# Patient Record
Sex: Male | Born: 1980 | Race: White | Hispanic: No | Marital: Single | Smoking: Current every day smoker
Health system: Southern US, Community
[De-identification: ages and names within clinical notes are randomized; demographics above are authoritative.]

---

## 2005-04-28 ENCOUNTER — Emergency Department: Payer: Self-pay | Admitting: Emergency Medicine

## 2016-08-10 ENCOUNTER — Encounter: Payer: Self-pay | Admitting: Emergency Medicine

## 2016-08-10 ENCOUNTER — Emergency Department
Admission: EM | Admit: 2016-08-10 | Discharge: 2016-08-10 | Disposition: A | Discharge status: Home / Self Care | Payer: Self-pay | Attending: Emergency Medicine | Admitting: Emergency Medicine

## 2016-08-10 ENCOUNTER — Emergency Department: Payer: Self-pay

## 2016-08-10 DIAGNOSIS — F17210 Nicotine dependence, cigarettes, uncomplicated: Secondary | ICD-10-CM | POA: Insufficient documentation

## 2016-08-10 DIAGNOSIS — K2900 Acute gastritis without bleeding: Secondary | ICD-10-CM | POA: Insufficient documentation

## 2016-08-10 LAB — CBC
HEMATOCRIT: 48.8 % (ref 40.0–52.0)
HEMOGLOBIN: 16.5 g/dL (ref 13.0–18.0)
MCH: 33.2 pg (ref 26.0–34.0)
MCHC: 33.8 g/dL (ref 32.0–36.0)
MCV: 98.1 fL (ref 80.0–100.0)
PLATELETS: 236 10*3/uL (ref 150–440)
RBC: 4.97 MIL/uL (ref 4.40–5.90)
RDW: 12.4 % (ref 11.5–14.5)
WBC: 10.3 10*3/uL (ref 3.8–10.6)

## 2016-08-10 LAB — URINALYSIS, COMPLETE (UACMP) WITH MICROSCOPIC
BILIRUBIN URINE: NEGATIVE
GLUCOSE, UA: NEGATIVE mg/dL
Hgb urine dipstick: NEGATIVE
KETONES UR: 5 mg/dL — AB
LEUKOCYTES UA: NEGATIVE
Nitrite: NEGATIVE
PH: 9 — AB (ref 5.0–8.0)
PROTEIN: 30 mg/dL — AB
Specific Gravity, Urine: 1.02 (ref 1.005–1.030)

## 2016-08-10 LAB — COMPREHENSIVE METABOLIC PANEL
ALT: 22 U/L (ref 17–63)
AST: 21 U/L (ref 15–41)
Albumin: 4.8 g/dL (ref 3.5–5.0)
Alkaline Phosphatase: 48 U/L (ref 38–126)
Anion gap: 8 (ref 5–15)
BUN: 13 mg/dL (ref 6–20)
CHLORIDE: 103 mmol/L (ref 101–111)
CO2: 28 mmol/L (ref 22–32)
CREATININE: 1 mg/dL (ref 0.61–1.24)
Calcium: 10.1 mg/dL (ref 8.9–10.3)
Glucose, Bld: 114 mg/dL — ABNORMAL HIGH (ref 65–99)
POTASSIUM: 4.5 mmol/L (ref 3.5–5.1)
SODIUM: 139 mmol/L (ref 135–145)
Total Bilirubin: 0.8 mg/dL (ref 0.3–1.2)
Total Protein: 8 g/dL (ref 6.5–8.1)

## 2016-08-10 LAB — LIPASE, BLOOD: Lipase: 21 U/L (ref 11–51)

## 2016-08-10 MED ORDER — ONDANSETRON HCL 4 MG/2ML IJ SOLN
4.0000 mg | Freq: Once | INTRAMUSCULAR | Status: AC
Start: 2016-08-10 — End: 2016-08-10
  Administered 2016-08-10: 4 mg via INTRAVENOUS
  Filled 2016-08-10: qty 2

## 2016-08-10 MED ORDER — MORPHINE SULFATE (PF) 4 MG/ML IV SOLN
4.0000 mg | Freq: Once | INTRAVENOUS | Status: AC
Start: 2016-08-10 — End: 2016-08-10
  Administered 2016-08-10: 4 mg via INTRAVENOUS
  Filled 2016-08-10: qty 1

## 2016-08-10 MED ORDER — FAMOTIDINE 20 MG PO TABS: 20.0000 mg | ORAL_TABLET | Freq: Two times a day (BID) | ORAL | 1 refills | Status: AC

## 2016-08-10 MED ORDER — MORPHINE SULFATE (PF) 4 MG/ML IV SOLN
INTRAVENOUS | Status: AC
  Filled 2016-08-10: qty 1

## 2016-08-10 MED ORDER — ONDANSETRON HCL 4 MG PO TABS: 4.0000 mg | ORAL_TABLET | Freq: Every day | ORAL | 1 refills | Status: DC | PRN

## 2016-08-10 MED ORDER — SODIUM CHLORIDE 0.9 % IV SOLN
Freq: Once | INTRAVENOUS | Status: AC
  Administered 2016-08-10: 1000 mL via INTRAVENOUS

## 2016-08-10 NOTE — ED Notes (Signed)
Pt alert and oriented X4, active, cooperative, pt in NAD. RR even and unlabored, color WNL.  Pt informed to return if any life threatening symptoms occur.   

## 2016-08-10 NOTE — ED Triage Notes (Signed)
Pt c/o Nausea and Vomiting for the last 5 days, reports no intake without emesis during that entire time.  Pt reports productive cough and pain in epigastric area today.  Pt NAD appears without vomiting att, drinking sweet tea.

## 2016-08-10 NOTE — ED Notes (Signed)
Pt alert and oriented X4, active, cooperative, pt in NAD. RR even and unlabored, color WNL.  RUQ pain, nausea, worsening when trying to eat. Denies diarrhea. EDP at bedside to assess.

## 2016-08-10 NOTE — ED Provider Notes (Signed)
[facility] Emergency Department Provider Note        Time seen: ----------------------------------------- 9:24 PM on 08/10/2016 -----------------------------------------    I have reviewed the triage vital signs and the nursing notes.   HISTORY  Chief Complaint Nausea    HPI Pamalee LeydenDavid Roller is a 35 y.o. male who presents the ER for nausea vomiting for the last 5 days. Patient reports no intake without vomiting during the entire time. Patient has had some cough and pain in the epigastrium and lower chest today. He presents drinking tea. Currently pain is 610, nothing makes it better. He denies a history of this.   History reviewed. No pertinent past medical history.  There are no active problems to display for this patient.   History reviewed. No pertinent surgical history.  Allergies Amoxicillin  Social History Social History  Substance Use Topics  . Smoking status: Current Every Day Smoker    Packs/day: 0.75    Types: Cigarettes  . Smokeless tobacco: Never Used  . Alcohol use Yes    Review of Systems Constitutional: Negative for fever. Cardiovascular: Negative for chest pain. Respiratory: Negative for shortness of breath.Positive for cough Gastrointestinal: Positive for abdominal pain, vomiting Genitourinary: Negative for dysuria. Musculoskeletal: Negative for back pain. Skin: Negative for rash. Neurological: Negative for headaches, focal weakness or numbness.  10-point ROS otherwise negative.  ____________________________________________   PHYSICAL EXAM:  VITAL SIGNS: ED Triage Vitals  Enc Vitals Group     BP 08/10/16 2015 (!) 163/99     Pulse Rate 08/10/16 2015 88     Resp 08/10/16 2015 18     Temp 08/10/16 2015 98.3 F (36.8 C)     Temp Source 08/10/16 2015 Oral     SpO2 08/10/16 2015 94 %     Weight 08/10/16 2016 165 lb (74.8 kg)     Height 08/10/16 2016 5\' 9"  (1.753 m)     Head Circumference --      Peak Flow --    Pain Score 08/10/16 2017 6     Pain Loc --      Pain Edu? --      Excl. in GC? --     Constitutional: Alert and oriented. Well appearing and in no distress. Eyes: Conjunctivae are normal. PERRL. Normal extraocular movements. ENT   Head: Normocephalic and atraumatic.   Nose: No congestion/rhinnorhea.   Mouth/Throat: Mucous membranes are moist.   Neck: No stridor. Cardiovascular: Normal rate, regular rhythm. No murmurs, rubs, or gallops. Respiratory: Normal respiratory effort without tachypnea nor retractions. Breath sounds are clear and equal bilaterally. No wheezes/rales/rhonchi. Gastrointestinal: Soft and nontender. Normal bowel sounds Musculoskeletal: Nontender with normal range of motion in all extremities. No lower extremity tenderness nor edema. Neurologic:  Normal speech and language. No gross focal neurologic deficits are appreciated.  Skin:  Skin is warm, dry and intact. No rash noted. Psychiatric: Mood and affect are normal. Speech and behavior are normal.  ____________________________________________  ED COURSE:  Pertinent labs & imaging results that were available during my care of the patient were reviewed by me and considered in my medical decision making (see chart for details). Clinical Course   Patient is in no distress, we'll assess with labs, give IV fluids and antiemetics.  Procedures ____________________________________________   LABS (pertinent positives/negatives)  Labs Reviewed  COMPREHENSIVE METABOLIC PANEL - Abnormal; Notable for the following:       Result Value   Glucose, Bld 114 (*)    All other components within  normal limits  URINALYSIS, COMPLETE (UACMP) WITH MICROSCOPIC - Abnormal; Notable for the following:    Color, Urine YELLOW (*)    APPearance CLEAR (*)    pH 9.0 (*)    Ketones, ur 5 (*)    Protein, ur 30 (*)    Bacteria, UA RARE (*)    Squamous Epithelial / LPF 0-5 (*)    All other components within normal limits  LIPASE,  BLOOD  CBC    RADIOLOGY  Acute abdominal series Is unremarkable ____________________________________________  FINAL ASSESSMENT AND PLAN  Abdominal pain, vomiting  Plan: Patient with labs and imaging as dictated above. She is in no distress, he'll be placed on ant acids and antiemetics. He is stable for outpatient follow-up with his doctor. Likely viral etiology.   Emily FilbertWilliams, Jylan Loeza E, MD   Note: This dictation was prepared with Dragon dictation. Any transcriptional errors that result from this process are unintentional    Emily FilbertJonathan E Lamari Beckles, MD 08/10/16 2227

## 2016-08-10 NOTE — ED Notes (Signed)
Patient transported to X-ray 

## 2017-08-01 ENCOUNTER — Other Ambulatory Visit: Payer: Self-pay

## 2017-08-01 ENCOUNTER — Encounter: Payer: Self-pay | Admitting: Emergency Medicine

## 2017-08-01 DIAGNOSIS — M25551 Pain in right hip: Secondary | ICD-10-CM | POA: Insufficient documentation

## 2017-08-01 DIAGNOSIS — Z79899 Other long term (current) drug therapy: Secondary | ICD-10-CM | POA: Insufficient documentation

## 2017-08-01 DIAGNOSIS — F17210 Nicotine dependence, cigarettes, uncomplicated: Secondary | ICD-10-CM | POA: Insufficient documentation

## 2017-08-01 DIAGNOSIS — M545 Low back pain: Secondary | ICD-10-CM | POA: Insufficient documentation

## 2017-08-01 NOTE — ED Triage Notes (Signed)
Pt arrives ambulatory to triage with c/o lower back pain x 3-4 months. 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 Pt is in NAD.

## 2017-08-02 ENCOUNTER — Emergency Department
Admission: EM | Admit: 2017-08-02 | Discharge: 2017-08-02 | Disposition: A | Discharge status: Home / Self Care | Payer: Self-pay | Attending: Emergency Medicine | Admitting: Emergency Medicine

## 2017-08-02 ENCOUNTER — Emergency Department: Payer: Self-pay

## 2017-08-02 DIAGNOSIS — M545 Low back pain, unspecified: Secondary | ICD-10-CM

## 2017-08-02 MED ORDER — KETOROLAC TROMETHAMINE 30 MG/ML IJ SOLN
15.0000 mg | Freq: Once | INTRAMUSCULAR | Status: AC
  Administered 2017-08-02: 15 mg via INTRAMUSCULAR
  Filled 2017-08-02: qty 1

## 2017-08-02 MED ORDER — HYDROCODONE-ACETAMINOPHEN 5-325 MG PO TABS: 1.0000 | ORAL_TABLET | ORAL | 0 refills | Status: DC | PRN

## 2017-08-02 MED ORDER — ACETAMINOPHEN 325 MG PO TABS
650.0000 mg | ORAL_TABLET | Freq: Once | ORAL | Status: AC
  Administered 2017-08-02: 650 mg via ORAL
  Filled 2017-08-02: qty 2

## 2017-08-02 MED ORDER — OXYCODONE-ACETAMINOPHEN 5-325 MG PO TABS
1.0000 | ORAL_TABLET | Freq: Once | ORAL | Status: AC
  Administered 2017-08-02: 1 via ORAL
  Filled 2017-08-02: qty 1

## 2017-08-02 MED ORDER — PREDNISONE 20 MG PO TABS
60.0000 mg | ORAL_TABLET | ORAL | Status: AC
  Administered 2017-08-02: 60 mg via ORAL
  Filled 2017-08-02: qty 3

## 2017-08-02 MED ORDER — IBUPROFEN 600 MG PO TABS: ORAL_TABLET | ORAL | 0 refills | Status: DC

## 2017-08-02 MED ORDER — PREDNISONE 10 MG PO TABS: ORAL_TABLET | ORAL | 0 refills | Status: DC

## 2017-08-02 NOTE — ED Provider Notes (Signed)
[facility] Emergency Department Provider Note  ____________________________________________   First MD Initiated Contact with Patient 08/02/17 [phone]     (approximate)  I have reviewed the triage vital signs and the nursing notes.   HISTORY  Chief Complaint Back Pain    HPI Harry LeydenDavid Ebling is a 36 y.o. male with no known chronic medical issues and no primary care doctor who presents for evaluation of 3-4 months of right-sided hip or back pain.  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  Rest makes it a little bit better but it never goes away.  It is been gradually worsening over an extended period of time and is now causing him difficulty with sleeping as well as with working.  He has no numbness or tingling or pain that radiates down his leg; the pain is fairly well isolated to his right hip.  He denies fever/chills, chest pain, shortness of breath, unintentional weight loss, nausea, vomiting, abdominal pain, and dysuria or any other urinary abnormalities.  He has not tried anything in particular to make it better.  He does not have a doctor or any insurance which is why he has not had the pain evaluated before now.  He is able to ambulate but sometimes limps because of the pain.  History reviewed. No pertinent past medical history.  There are no active problems to display for this patient.   History reviewed. No pertinent surgical history.  Prior to Admission medications   Medication Sig Start Date End Date Taking? Authorizing Provider  famotidine (PEPCID) 20 MG tablet Take 1 tablet (20 mg total) by mouth 2 (two) times daily. 08/10/16   Emily FilbertWilliams, Jonathan E, MD  HYDROcodone-acetaminophen (NORCO/VICODIN) 5-325 MG tablet Take 1-2 tablets by mouth every 4 (four) hours as needed for moderate pain. 08/02/17   Loleta RoseForbach, Harrietta Incorvaia, MD  ibuprofen (ADVIL,MOTRIN)  600 MG tablet Take 1 tablet by mouth three times daily with meals 08/02/17   Loleta RoseForbach, Rain Friedt, MD  ondansetron (ZOFRAN) 4 MG tablet Take 1 tablet (4 mg total) by mouth daily as needed for nausea or vomiting. 08/10/16   Emily FilbertWilliams, Jonathan E, MD  predniSONE (DELTASONE) 10 MG tablet Take 6 tabs (60 mg) PO x 3 days, then take 4 tabs (40 mg) PO x 3 days, then take 2 tabs (20 mg) PO x 3 days, then take 1 tab (10 mg) PO x 3 days, then take 1/2 tab (5 mg) PO x 4 days. 08/02/17   Loleta RoseForbach, Hani Patnode, MD    Allergies Amoxicillin  No family history on file.  Social History Social History   Tobacco Use  . Smoking status: Current Every Day Smoker    Packs/day: 0.50    Types: Cigarettes  . Smokeless tobacco: Never Used  Substance Use Topics  . Alcohol use: Yes  . Drug use: Yes    Types: Marijuana    Review of Systems Constitutional: No fever/chills Cardiovascular: Denies chest pain. Respiratory: Denies shortness of breath. Gastrointestinal: No abdominal pain.  No nausea, no vomiting.  No diarrhea.  No constipation. Genitourinary: Negative for dysuria, urinary incontinence, and urinary frequency Musculoskeletal: Right sided low back vs hip pain.  No RLE pain. Neurological: Negative for headaches, focal weakness or numbness.   ____________________________________________   PHYSICAL EXAM:  VITAL SIGNS: ED Triage Vitals  Enc Vitals Group     BP 08/01/17 2349 120/90  Pulse Rate 08/01/17 2349 92     Resp 08/01/17 2349 18     Temp 08/01/17 2349 97.9 F (36.6 C)     Temp Source 08/01/17 2349 Oral     SpO2 08/01/17 2349 96 %     Weight 08/01/17 2348 77.1 kg (170 lb)     Height 08/01/17 2348 1.753 m (5\' 9" )     Head Circumference --      Peak Flow --      Pain Score 08/01/17 2348 8     Pain Loc --      Pain Edu? --      Excl. in GC? --     Constitutional: Alert and oriented.  Disheveled but generally well-appearing.  No acute distress Eyes: Conjunctivae are normal.  Head:  Atraumatic. Respiratory: Normal respiratory effort.  No retractions.  Musculoskeletal: No lower extremity tenderness nor edema. No gross deformities of extremities.  Tenderness to palpation in the region of the right sacroiliac joint as well as lateral to the joint with no palpable abnormalities, just the tenderness. Neurologic:  Normal speech and language. No gross focal neurologic deficits are appreciated.  Ambulatory without difficulty Skin:  Skin is warm, dry and intact. No rash noted, no erythema, no induration or fluctuance around the site of tenderness, no indication of any localized infection Psychiatric: Mood and affect are normal. Speech and behavior are normal.  ____________________________________________   LABS (all labs ordered are listed, but only abnormal results are displayed)  Labs Reviewed - No data to display ____________________________________________  EKG  None - EKG not ordered by ED physician ____________________________________________  RADIOLOGY   Dg Pelvis 1-2 Views  Result Date: 08/02/2017 CLINICAL DATA:  Right iliac crest and sacroiliac joint pain for 3-4 months. No known injury. EXAM: PELVIS - 1-2 VIEW COMPARISON:  None. FINDINGS: The cortical margins of the bony pelvis are intact. No fracture. Pubic symphysis and sacroiliac joints are congruent. Both femoral heads are well-seated in the respective acetabula. No focal bone abnormality. IMPRESSION: Negative radiograph of the pelvis. Electronically Signed   By: Rubye Oaks M.D.   On: 08/02/2017 04:46    ____________________________________________   PROCEDURES  Critical Care performed: No   Procedure(s) performed:   Procedures   ____________________________________________   INITIAL IMPRESSION / ASSESSMENT AND PLAN / ED COURSE  As part of my medical decision making, I reviewed the following data within the electronic MEDICAL RECORD NUMBER Nursing notes reviewed and incorporated     Differential diagnosis includes, but is not limited to, musculoskeletal pain/strain, sacroiliitis, osteomyelitis/discitis, herniated disc, occult fracture.  Given the patient's physical exam and history of present illness, I think that subacute/chronic musculoskeletal strain and/or sacroiliitis are most likely.  Given the degree of the patient's discomfort I will treat him with a combination of therapies including a prednisone taper, NSAIDs, and Norco.  I reviewed the patient's prescription history over the last 24 months in the multi-state controlled substances database(s) that includes [place], [place], [place], [place], [place], [place], [place], [place], [place], [place], [place], [place], [place], [place], and [place].  The patient has filled no controlled substances during that time.  Stressed to him the importance of following up as an outpatient and gave him the number for the patient navigator to get set up with an outpatient doctor as well as the name and number for Dr. Yves Dill with physiatry who may be able to help him with the chronic pain in his right hip.  I gave  my usual and customary return precautions and he understands and agrees with the plan.  Clinical Course as of Aug 02 554  Sat Aug 02, 2017  [id number] DG Pelvis 1-2 Views [CF]    Clinical Course User Index [CF] Loleta RoseForbach, Rayaan Lorah, MD    ____________________________________________  FINAL CLINICAL IMPRESSION(S) / ED DIAGNOSES  Final diagnoses:  Acute right-sided low back pain without sciatica     MEDICATIONS GIVEN DURING THIS VISIT:  Medications  predniSONE (DELTASONE) tablet 60 mg (60 mg Oral Given 08/02/17 0520)  oxyCODONE-acetaminophen (PERCOCET/ROXICET) 5-325 MG per tablet 1 tablet (1 tablet Oral Given 08/02/17 0520)  acetaminophen (TYLENOL) tablet 650 mg (650 mg Oral Given 08/02/17 0520)  ketorolac (TORADOL) 30 MG/ML injection 15 mg (15 mg Intramuscular Given 08/02/17 0520)     ED  Discharge Orders        Ordered    predniSONE (DELTASONE) 10 MG tablet     08/02/17 0510    HYDROcodone-acetaminophen (NORCO/VICODIN) 5-325 MG tablet  Every 4 hours PRN     08/02/17 0510    ibuprofen (ADVIL,MOTRIN) 600 MG tablet     08/02/17 0510       Note:  This document was prepared using Dragon voice recognition software and may include unintentional dictation errors.    Loleta RoseForbach, Mika Anastasi, MD 08/02/17 [phone]

## 2017-08-02 NOTE — ED Notes (Signed)
Reviewed discharge instructions, follow-up care, and prescriptions with patient. Patient verbalized understanding of all information reviewed. Patient stable, with no distress noted at this time.    

## 2017-08-02 NOTE — Discharge Instructions (Signed)
You have been seen in the Emergency Department (ED)  today for back pain.  Your workup and exam have not shown any acute abnormalities and you are likely suffering from muscle strain or possible problems with your discs, but there is no treatment that will fix your symptoms at this time.  Please take Motrin (ibuprofen) as needed for your pain according to the instructions written on the box.  Alternatively, for the next five days you can take 600mg three times daily with meals (it may upset your stomach). ° °Take Norco as prescribed for severe pain. Do not drink alcohol, drive or participate in any other potentially dangerous activities while taking this medication as it may make you sleepy. Do not take this medication with any other sedating medications, either prescription or over-the-counter. If you were prescribed Percocet or Vicodin, do not take these with acetaminophen (Tylenol) as it is already contained within these medications. °  °This medication is an opiate (or narcotic) pain medication and can be habit forming.  Use it as little as possible to achieve adequate pain control.  Do not use or use it with extreme caution if you have a history of opiate abuse or dependence.  If you are on a pain contract with your primary care doctor or a pain specialist, be sure to let them know you were prescribed this medication today from the [facility].  This medication is intended for your use only - do not give any to anyone else and keep it in a secure place where nobody else, especially children, have access to it.  It will also cause or worsen constipation, so you may want to consider taking an over-the-counter stool softener while you are taking this medication. ° °Please follow up with your doctor as soon as possible regarding today's ED visit and your back pain.  Return to the ED for worsening back pain, fever, weakness or numbness of either leg, or if you develop either (1) an  inability to urinate or have bowel movements, or (2) loss of your ability to control your bathroom functions (if you start having "accidents"), or if you develop other new symptoms that concern you. ° °

## 2017-08-17 ENCOUNTER — Other Ambulatory Visit: Payer: Self-pay

## 2017-08-17 ENCOUNTER — Emergency Department: Payer: Self-pay

## 2017-08-17 ENCOUNTER — Emergency Department
Admission: EM | Admit: 2017-08-17 | Discharge: 2017-08-17 | Disposition: A | Discharge status: Home / Self Care | Payer: Self-pay | Attending: Emergency Medicine | Admitting: Emergency Medicine

## 2017-08-17 ENCOUNTER — Encounter: Payer: Self-pay | Admitting: Emergency Medicine

## 2017-08-17 DIAGNOSIS — M5416 Radiculopathy, lumbar region: Secondary | ICD-10-CM | POA: Insufficient documentation

## 2017-08-17 DIAGNOSIS — M5410 Radiculopathy, site unspecified: Secondary | ICD-10-CM

## 2017-08-17 DIAGNOSIS — F17210 Nicotine dependence, cigarettes, uncomplicated: Secondary | ICD-10-CM | POA: Insufficient documentation

## 2017-08-17 DIAGNOSIS — Z791 Long term (current) use of non-steroidal anti-inflammatories (NSAID): Secondary | ICD-10-CM | POA: Insufficient documentation

## 2017-08-17 MED ORDER — TRAMADOL HCL 50 MG PO TABS
50.0000 mg | ORAL_TABLET | Freq: Once | ORAL | Status: AC
  Administered 2017-08-17: 50 mg via ORAL
  Filled 2017-08-17: qty 1

## 2017-08-17 MED ORDER — IBUPROFEN 800 MG PO TABS: 800.0000 mg | ORAL_TABLET | Freq: Three times a day (TID) | ORAL | 0 refills | Status: DC | PRN

## 2017-08-17 MED ORDER — CYCLOBENZAPRINE HCL 10 MG PO TABS: 10.0000 mg | ORAL_TABLET | Freq: Three times a day (TID) | ORAL | 0 refills | Status: DC | PRN

## 2017-08-17 MED ORDER — CYCLOBENZAPRINE HCL 10 MG PO TABS
10.0000 mg | ORAL_TABLET | Freq: Once | ORAL | Status: AC
  Administered 2017-08-17: 10 mg via ORAL
  Filled 2017-08-17: qty 1

## 2017-08-17 MED ORDER — TRAMADOL HCL 50 MG PO TABS: 50.0000 mg | ORAL_TABLET | Freq: Four times a day (QID) | ORAL | 0 refills | Status: DC | PRN

## 2017-08-17 MED ORDER — IBUPROFEN 800 MG PO TABS
800.0000 mg | ORAL_TABLET | Freq: Once | ORAL | Status: AC
  Administered 2017-08-17: 800 mg via ORAL
  Filled 2017-08-17: qty 1

## 2017-08-17 NOTE — ED Triage Notes (Signed)
Pt presents to ED c/o L-sided lower back pain radiating into L hip and lower leg for several months. States he was seen in ED recently and given prednisone that helped somewhat but he has run out. Pt does a lot of heavy lifting for work [employer]).

## 2017-08-17 NOTE — ED Provider Notes (Signed)
[facility] Emergency Department Provider Note   _________________________________________[facility]___   First MD Initiated Contact with Patient 08/17/17 1955     (approximate)  I have reviewed the triage vital signs and the nursing notes.   HISTORY  Chief Complaint Back Pain    HPI Harry Graham is a 36 y.o. male patient complaining of back pain with radicular component to the left lower extremity. Patient said he was seen recently in the ED was given prednisone was seen and helped his condition. Patient state he ran out of the medication his complaint return. Patient states he performs a great deal of heavy lifting at work. Patient denies bladder or bowel dysfunction. Patient rates pain as a 9/10. Patient described a pain as "shooting".  History reviewed. No pertinent past medical history.  There are no active problems to display for this patient.   History reviewed. No pertinent surgical history.  Prior to Admission medications   Medication Sig Start Date End Date Taking? Authorizing Provider  cyclobenzaprine (FLEXERIL) 10 MG tablet Take 1 tablet (10 mg total) by mouth 3 (three) times daily as needed. 08/17/17   Joni ReiningSmith, Willie Plain K, PA-C  famotidine (PEPCID) 20 MG tablet Take 1 tablet (20 mg total) by mouth 2 (two) times daily. 08/10/16   Emily FilbertWilliams, Jonathan E, MD  HYDROcodone-acetaminophen (NORCO/VICODIN) 5-325 MG tablet Take 1-2 tablets by mouth every 4 (four) hours as needed for moderate pain. 08/02/17   Loleta RoseForbach, Cory, MD  ibuprofen (ADVIL,MOTRIN) 600 MG tablet Take 1 tablet by mouth three times daily with meals 08/02/17   Loleta RoseForbach, Cory, MD  ibuprofen (ADVIL,MOTRIN) 800 MG tablet Take 1 tablet (800 mg total) by mouth every 8 (eight) hours as needed. 08/17/17   Joni ReiningSmith, Anothony Bursch K, PA-C  ondansetron (ZOFRAN) 4 MG tablet Take 1 tablet (4 mg total) by mouth daily as needed for nausea or vomiting. 08/10/16   Emily FilbertWilliams, Jonathan E, MD  predniSONE (DELTASONE) 10 MG tablet Take 6  tabs (60 mg) PO x 3 days, then take 4 tabs (40 mg) PO x 3 days, then take 2 tabs (20 mg) PO x 3 days, then take 1 tab (10 mg) PO x 3 days, then take 1/2 tab (5 mg) PO x 4 days. 08/02/17   Loleta RoseForbach, Cory, MD  traMADol (ULTRAM) 50 MG tablet Take 1 tablet (50 mg total) by mouth every 6 (six) hours as needed for moderate pain. 08/17/17   Joni ReiningSmith, Aydian Dimmick K, PA-C    Allergies Amoxicillin  History reviewed. No pertinent family history.  Social History Social History   Tobacco Use  . Smoking status: Current Every Day Smoker    Packs/day: 0.50    Types: Cigarettes  . Smokeless tobacco: Never Used  Substance Use Topics  . Alcohol use: Yes  . Drug use: Yes    Types: Marijuana    Review of Systems  Constitutional: No fever/chills Eyes: No visual changes. ENT: No sore throat. Cardiovascular: Denies chest pain. Respiratory: Denies shortness of breath. Gastrointestinal: No abdominal pain.  No nausea, no vomiting.  No diarrhea.  No constipation. Genitourinary: Negative for dysuria. Musculoskeletal: Positive for back pain  Skin: Negative for rash. Neurological: Negative for headaches, focal weakness or numbness. Allergic/Immunilogical: Amoxil ____________________________________________   PHYSICAL EXAM:  VITAL SIGNS: ED Triage Vitals  Enc Vitals Group     BP 08/17/17 1910 (!) 107/92     Pulse Rate 08/17/17 1910 90     Resp 08/17/17 1910 18     Temp 08/17/17 1910 98 F (  36.7 C)     Temp Source 08/17/17 1910 Oral     SpO2 08/17/17 1910 98 %     Weight 08/17/17 1912 165 lb (74.8 kg)     Height 08/17/17 1912 5\' 9"  (1.753 m)     Head Circumference --      Peak Flow --      Pain Score 08/17/17 1919 9     Pain Loc --      Pain Edu? --      Excl. in GC? --     Constitutional: Alert and oriented. Well appearing and in no acute distress. Cardiovascular: Normal rate, regular rhythm. Grossly normal heart sounds.  Good peripheral circulation. Respiratory: Normal respiratory effort.  No  retractions. Lungs CTAB. Gastrointestinal: Soft and nontender. No distention. No abdominal bruits. No CVA tenderness. Musculoskeletal: No obvious lumbar spine deformity. No guarding palpation spinal processes. Patient decreased range of motion right lateral movements. Patient Straight-leg test.  Neurologic:  Normal speech and language. No gross focal neurologic deficits are appreciated. No gait instability. Skin:  Skin is warm, dry and intact. No rash noted. Psychiatric: Mood and affect are normal. Speech and behavior are normal.  ____________________________________________   LABS (all labs ordered are listed, but only abnormal results are displayed)  Labs Reviewed - No data to display ____________________________________________  EKG   ____________________________________________  RADIOLOGY  Dg Lumbar Spine 2-3 Views  Result Date: 08/17/2017 CLINICAL DATA:  Lumbago with left-sided radicular symptoms EXAM: LUMBAR SPINE - 2-3 VIEW COMPARISON:  None. FINDINGS: Upright frontal and upright lateral images were obtained. There are 5 non-rib-bearing lumbar type vertebral bodies. There is no fracture or spondylolisthesis. The disc spaces appear normal. No erosive change. IMPRESSION: No fracture or spondylolisthesis.  No evident arthropathy. Electronically Signed   By: Bretta BangWilliam  Woodruff III M.D.   On: 08/17/2017 20:30    ____________________________________________   PROCEDURES  Procedure(s) performed: None  Procedures  Critical Care performed: No  ____________________________________________   INITIAL IMPRESSION / ASSESSMENT AND PLAN / ED COURSE  As part of my medical decision making, I reviewed the following data within the electronic MEDICAL RECORD NUMBER    Radicular back pain. Discussed x-ray finding with patient. Patient given discharge Instructions. Patient advised we will lumbar support when working. Patient advised take medication as directed and follow orthopedics  condition persists.      ____________________________________________   FINAL CLINICAL IMPRESSION(S) / ED DIAGNOSES  Final diagnoses:  Radicular low back pain     ED Discharge Orders        Ordered    ibuprofen (ADVIL,MOTRIN) 800 MG tablet  Every 8 hours PRN     08/17/17 2035    cyclobenzaprine (FLEXERIL) 10 MG tablet  3 times daily PRN     08/17/17 2035    traMADol (ULTRAM) 50 MG tablet  Every 6 hours PRN     08/17/17 2035       Note:  This document was prepared using Dragon voice recognition software and may include unintentional dictation errors.    Joni ReiningSmith, Johnnae Impastato K, PA-C 08/17/17 2041    Arnaldo NatalMalinda, Paul F, MD 08/17/17 [phone]

## 2017-08-17 NOTE — ED Notes (Signed)
Pt states several months of low back pain radiating down posterior left hip. Pt states he feels like he limps at times "to keep the pressure off". Pt ambulatory in room without difficulty. Pt appears in no acute distress.

## 2020-09-02 ENCOUNTER — Other Ambulatory Visit: Payer: Self-pay

## 2020-09-02 ENCOUNTER — Emergency Department: Payer: Self-pay

## 2020-09-02 ENCOUNTER — Emergency Department
Admission: EM | Admit: 2020-09-02 | Discharge: 2020-09-02 | Disposition: A | Discharge status: Home / Self Care | Payer: Self-pay | Attending: Emergency Medicine | Admitting: Emergency Medicine

## 2020-09-02 DIAGNOSIS — R079 Chest pain, unspecified: Secondary | ICD-10-CM | POA: Insufficient documentation

## 2020-09-02 DIAGNOSIS — N132 Hydronephrosis with renal and ureteral calculous obstruction: Secondary | ICD-10-CM | POA: Insufficient documentation

## 2020-09-02 DIAGNOSIS — M545 Low back pain, unspecified: Secondary | ICD-10-CM | POA: Insufficient documentation

## 2020-09-02 DIAGNOSIS — E876 Hypokalemia: Secondary | ICD-10-CM | POA: Insufficient documentation

## 2020-09-02 DIAGNOSIS — N200 Calculus of kidney: Secondary | ICD-10-CM

## 2020-09-02 DIAGNOSIS — F17210 Nicotine dependence, cigarettes, uncomplicated: Secondary | ICD-10-CM | POA: Insufficient documentation

## 2020-09-02 LAB — URINALYSIS, COMPLETE (UACMP) WITH MICROSCOPIC
Bilirubin Urine: NEGATIVE
Glucose, UA: NEGATIVE mg/dL
Ketones, ur: NEGATIVE mg/dL
Leukocytes,Ua: NEGATIVE
Nitrite: NEGATIVE
Protein, ur: 100 mg/dL — AB
RBC / HPF: >50 RBC/hpf — ABNORMAL HIGH (ref 0–5)
Specific Gravity, Urine: 1.021 (ref 1.005–1.030)
Squamous Epithelial / LPF: NONE SEEN (ref 0–5)
WBC, UA: >50 WBC/hpf — ABNORMAL HIGH (ref 0–5)
pH: 6 (ref 5.0–8.0)

## 2020-09-02 LAB — BASIC METABOLIC PANEL
Anion gap: 16 — ABNORMAL HIGH (ref 5–15)
BUN: 16 mg/dL (ref 6–20)
CO2: 24 mmol/L (ref 22–32)
Calcium: 9.8 mg/dL (ref 8.9–10.3)
Chloride: 98 mmol/L (ref 98–111)
Creatinine, Ser: 0.9 mg/dL (ref 0.61–1.24)
GFR, Estimated: >60 mL/min (ref >60)
Glucose, Bld: 163 mg/dL — ABNORMAL HIGH (ref 70–99)
Potassium: 3.2 mmol/L — ABNORMAL LOW (ref 3.5–5.1)
Sodium: 138 mmol/L (ref 135–145)

## 2020-09-02 LAB — CBC
HCT: 44.9 % (ref 39.0–52.0)
Hemoglobin: 15.6 g/dL (ref 13.0–17.0)
MCH: 33.4 pg (ref 26.0–34.0)
MCHC: 34.7 g/dL (ref 30.0–36.0)
MCV: 96.1 fL (ref 80.0–100.0)
Platelets: 271 10*3/uL (ref 150–400)
RBC: 4.67 MIL/uL (ref 4.22–5.81)
RDW: 11.3 % — ABNORMAL LOW (ref 11.5–15.5)
WBC: 8.9 10*3/uL (ref 4.0–10.5)
nRBC: 0 % (ref 0.0–0.2)

## 2020-09-02 LAB — HEMOGLOBIN A1C
Hgb A1c MFr Bld: 5.5 % (ref 4.8–5.6)
Mean Plasma Glucose: 111.15 mg/dL

## 2020-09-02 LAB — HEPATIC FUNCTION PANEL
ALT: 18 U/L (ref 0–44)
AST: 26 U/L (ref 15–41)
Albumin: 4.7 g/dL (ref 3.5–5.0)
Alkaline Phosphatase: 57 U/L (ref 38–126)
Bilirubin, Direct: <0.1 mg/dL (ref 0.0–0.2)
Total Bilirubin: 0.6 mg/dL (ref 0.3–1.2)
Total Protein: 7.5 g/dL (ref 6.5–8.1)

## 2020-09-02 LAB — TROPONIN I (HIGH SENSITIVITY): Troponin I (High Sensitivity): <2 ng/L (ref <18)

## 2020-09-02 MED ORDER — TAMSULOSIN HCL 0.4 MG PO CAPS: 0.4000 mg | ORAL_CAPSULE | Freq: Every day | ORAL | 0 refills | Status: AC

## 2020-09-02 MED ORDER — ONDANSETRON HCL 4 MG/2ML IJ SOLN
4.0000 mg | Freq: Once | INTRAMUSCULAR | Status: AC
  Administered 2020-09-02: 4 mg via INTRAVENOUS
  Filled 2020-09-02: qty 2

## 2020-09-02 MED ORDER — OXYCODONE HCL 5 MG PO TABS
5.0000 mg | ORAL_TABLET | Freq: Once | ORAL | Status: AC
Start: 2020-09-02 — End: 2020-09-02
  Administered 2020-09-02: 5 mg via ORAL
  Filled 2020-09-02: qty 1

## 2020-09-02 MED ORDER — KETOROLAC TROMETHAMINE 30 MG/ML IJ SOLN
30.0000 mg | Freq: Once | INTRAMUSCULAR | Status: AC
  Administered 2020-09-02: 30 mg via INTRAVENOUS
  Filled 2020-09-02: qty 1

## 2020-09-02 MED ORDER — LACTATED RINGERS IV BOLUS
1000.0000 mL | Freq: Once | INTRAVENOUS | Status: AC
  Administered 2020-09-02: 1000 mL via INTRAVENOUS

## 2020-09-02 MED ORDER — HYDROMORPHONE HCL 1 MG/ML IJ SOLN
0.5000 mg | Freq: Once | INTRAMUSCULAR | Status: AC
Start: 2020-09-02 — End: 2020-09-02
  Administered 2020-09-02: 0.5 mg via INTRAVENOUS
  Filled 2020-09-02: qty 1

## 2020-09-02 MED ORDER — ACETAMINOPHEN 500 MG PO TABS
1000.0000 mg | ORAL_TABLET | Freq: Once | ORAL | Status: AC
  Administered 2020-09-02: 1000 mg via ORAL
  Filled 2020-09-02: qty 2

## 2020-09-02 MED ORDER — CIPROFLOXACIN IN D5W 400 MG/200ML IV SOLN
400.0000 mg | Freq: Once | INTRAVENOUS | Status: AC
  Administered 2020-09-02: 400 mg via INTRAVENOUS
  Filled 2020-09-02: qty 200

## 2020-09-02 MED ORDER — ONDANSETRON HCL 4 MG PO TABS: 4.0000 mg | ORAL_TABLET | Freq: Three times a day (TID) | ORAL | 0 refills | Status: AC | PRN

## 2020-09-02 MED ORDER — SULFAMETHOXAZOLE-TRIMETHOPRIM 800-160 MG PO TABS: 1.0000 | ORAL_TABLET | Freq: Two times a day (BID) | ORAL | 0 refills | Status: AC

## 2020-09-02 MED ORDER — OXYCODONE-ACETAMINOPHEN 5-325 MG PO TABS: 1.0000 | ORAL_TABLET | Freq: Three times a day (TID) | ORAL | 0 refills | Status: AC | PRN

## 2020-09-02 MED ORDER — POTASSIUM CHLORIDE CRYS ER 20 MEQ PO TBCR
40.0000 meq | EXTENDED_RELEASE_TABLET | Freq: Once | ORAL | Status: AC
  Administered 2020-09-02: 40 meq via ORAL
  Filled 2020-09-02: qty 2

## 2020-09-02 NOTE — ED Provider Notes (Signed)
[facility] Emergency Department Provider Note  ____________________________________________   Event Date/Time   First MD Initiated Contact with Patient 09/02/20 1623     (approximate)  I have reviewed the triage vital signs and the nursing notes.   HISTORY  Chief Complaint Flank Pain   HPI Harry Graham is a 40 y.o. male without significant past medical history who presents for assessment of acute onset of nontraumatic right lower back, right flank and right lower quadrant abdominal pain that began this morning.  He notes he had a little bit of chest pain yesterday but none today and that his lower back and abdominal pain will bring him to the emergency room.  States he is currently 6/10 intensity but it was earlier 10/10 intensity.  He states he saw a little bit of blood in his urine has not had any burning with urination.  He has had a little bit of nausea but has not had any vomiting, cough, headache, earache, sore throat, fevers, chills, rash or extremity pain.  He has a strong family history of kidney stones but is never personally had a kidney stone.  No significant NSAID use, EtOH use or illegal drug use.  No clearly getting aggravating factors.  No prior similar episodes.          History reviewed. No pertinent past medical history.  There are no problems to display for this patient.   History reviewed. No pertinent surgical history.  Prior to Admission medications   Medication Sig Start Date End Date Taking? Authorizing Provider  ondansetron (ZOFRAN) 4 MG tablet Take 1 tablet (4 mg total) by mouth every 8 (eight) hours as needed for up to 10 doses for nausea or vomiting. 09/02/20  Yes Gilles Chiquito, MD  oxyCODONE-acetaminophen (PERCOCET) 5-325 MG tablet Take 1 tablet by mouth every 8 (eight) hours as needed for up to 5 days for severe pain. 09/02/20 09/07/20 Yes Gilles Chiquito, MD  sulfamethoxazole-trimethoprim (BACTRIM DS) 800-160 MG tablet Take 1  tablet by mouth 2 (two) times daily for 10 days. 09/02/20 09/12/20 Yes Gilles Chiquito, MD  tamsulosin (FLOMAX) 0.4 MG CAPS capsule Take 1 capsule (0.4 mg total) by mouth daily for 5 days. 09/02/20 09/07/20 Yes Gilles Chiquito, MD  cyclobenzaprine (FLEXERIL) 10 MG tablet Take 1 tablet (10 mg total) by mouth 3 (three) times daily as needed. 08/17/17   Joni Reining, PA-C  famotidine (PEPCID) 20 MG tablet Take 1 tablet (20 mg total) by mouth 2 (two) times daily. 08/10/16   Emily Filbert, MD  predniSONE (DELTASONE) 10 MG tablet Take 6 tabs (60 mg) PO x 3 days, then take 4 tabs (40 mg) PO x 3 days, then take 2 tabs (20 mg) PO x 3 days, then take 1 tab (10 mg) PO x 3 days, then take 1/2 tab (5 mg) PO x 4 days. 08/02/17   Loleta Rose, MD    Allergies Amoxicillin  History reviewed. No pertinent family history.  Social History Social History   Tobacco Use  . Smoking status: Current Every Day Smoker    Packs/day: 0.50    Types: Cigarettes  . Smokeless tobacco: Never Used  Substance Use Topics  . Alcohol use: Yes  . Drug use: Yes    Types: Marijuana    Review of Systems  Review of Systems  Constitutional: Negative for chills and fever.  HENT: Negative for sore throat.   Eyes: Negative for pain.  Respiratory: Negative for cough  and stridor.   Cardiovascular: Positive for chest pain ( yesterday, non today).  Gastrointestinal: Positive for abdominal pain and nausea. Negative for vomiting.  Genitourinary: Positive for flank pain.  Skin: Negative for rash.  Neurological: Negative for seizures, loss of consciousness and headaches.  Psychiatric/Behavioral: Negative for suicidal ideas.  All other systems reviewed and are negative.     ____________________________________________   PHYSICAL EXAM:  VITAL SIGNS: ED Triage Vitals [09/02/20 1349]  Enc Vitals Group     BP 138/90     Pulse Rate 82     Resp 18     Temp 97.8 F (36.6 C)     Temp Source Oral     SpO2 98 %     Weight  167 lb (75.8 kg)     Height 5\' 9"  (1.753 m)     Head Circumference      Peak Flow      Pain Score 10     Pain Loc      Pain Edu?      Excl. in GC?    Vitals:   09/02/20 1349  BP: 138/90  Pulse: 82  Resp: 18  Temp: 97.8 F (36.6 C)  SpO2: 98%   Physical Exam Vitals and nursing note reviewed.  Constitutional:      Appearance: He is well-developed and well-nourished.  HENT:     Head: Normocephalic and atraumatic.     Right Ear: External ear normal.     Left Ear: External ear normal.     Nose: Nose normal.  Eyes:     Conjunctiva/sclera: Conjunctivae normal.  Cardiovascular:     Rate and Rhythm: Normal rate and regular rhythm.     Heart sounds: No murmur heard.   Pulmonary:     Effort: Pulmonary effort is normal. No respiratory distress.     Breath sounds: Normal breath sounds.  Abdominal:     Palpations: Abdomen is soft.     Tenderness: There is no abdominal tenderness. There is right CVA tenderness. There is no left CVA tenderness.  Musculoskeletal:        General: No edema.     Cervical back: Neck supple.  Skin:    General: Skin is warm and dry.     Capillary Refill: Capillary refill takes less than 2 seconds.  Neurological:     Mental Status: He is alert and oriented to person, place, and time.  Psychiatric:        Mood and Affect: Mood and affect and mood normal.      ____________________________________________   LABS (all labs ordered are listed, but only abnormal results are displayed)  Labs Reviewed  URINALYSIS, COMPLETE (UACMP) WITH MICROSCOPIC - Abnormal; Notable for the following components:      Result Value   Color, Urine AMBER (*)    APPearance CLOUDY (*)    Hgb urine dipstick LARGE (*)    Protein, ur 100 (*)    RBC / HPF >50 (*)    WBC, UA >50 (*)    Bacteria, UA RARE (*)    All other components within normal limits  BASIC METABOLIC PANEL - Abnormal; Notable for the following components:   Potassium 3.2 (*)    Glucose, Bld 163 (*)     Anion gap 16 (*)    All other components within normal limits  CBC - Abnormal; Notable for the following components:   RDW 11.3 (*)    All other components within normal limits  URINE CULTURE  HEPATIC FUNCTION PANEL  HEMOGLOBIN A1C  TROPONIN I (HIGH SENSITIVITY)   ____________________________________________  EKG  Sinus rhythm with a ventricular rate of 74, normal axis, unremarkable intervals, incomplete right bundle branch block and no evidence of acute ischemia or other Arrhythmia ____________________________________________  RADIOLOGY  ED MD interpretation: Chest x-ray has no evidence of focal elevation, pneumothorax, effusion, edema or other acute intrathoracic process.  CT stone study shows evidence of right-sided 6 x 4 mm stone in the right proximal ureter with some mild hydro-.  No significant perinephric stranding or other acute intra-abdominal process.  Appendix is unremarkable.  Official radiology report(s): DG Chest 2 View  Result Date: 09/02/2020 CLINICAL DATA:  Chest pain EXAM: CHEST - 2 VIEW COMPARISON:  None. FINDINGS: The heart size and mediastinal contours are within normal limits. Both lungs are clear. The visualized skeletal structures are unremarkable. IMPRESSION: No active cardiopulmonary disease. Electronically Signed   By: Duanne Guess D.O.   On: 09/02/2020 17:39   CT Renal Stone Study  Result Date: 09/02/2020 CLINICAL DATA:  Right lower quadrant abdominal pain, flank pain EXAM: CT ABDOMEN AND PELVIS WITHOUT CONTRAST TECHNIQUE: Multidetector CT imaging of the abdomen and pelvis was performed following the standard protocol without IV contrast. COMPARISON:  None. FINDINGS: Lower chest: No acute abnormality. Hepatobiliary: Unremarkable unenhanced appearance of the liver. No focal liver lesion identified. Gallbladder within normal limits. No hyperdense gallstone. No biliary dilatation. Pancreas: Unremarkable. No pancreatic ductal dilatation or surrounding  inflammatory changes. Spleen: Normal in size without focal abnormality. Adrenals/Urinary Tract: Unremarkable adrenal glands. Obstructing 6 x 4 mm stone within the proximal right ureter resulting in mild right hydronephrosis. There are 1 or 2 additional tiny punctate bilateral renal calculi. No left-sided hydronephrosis. Left ureter is unremarkable. Urinary bladder is within normal limits. Stomach/Bowel: Small amount of hyperdense material within the distal esophagus and stomach, likely oral contrast. Stomach is within normal limits. Appendix appears normal (series 2, image 51. No evidence of bowel wall thickening, distention, or inflammatory changes. Vascular/Lymphatic: No significant vascular findings are present. No enlarged abdominal or pelvic lymph nodes. Reproductive: Prostate is unremarkable. Other: No free fluid. No abdominopelvic fluid collection. No pneumoperitoneum. No abdominal wall hernia. Musculoskeletal: No acute or significant osseous findings. IMPRESSION: 1. Obstructing 6 x 4 mm stone within the proximal right ureter resulting in mild right hydronephrosis. 2. Additional tiny punctate bilateral renal calculi. 3. Normal appendix. Electronically Signed   By: Duanne Guess D.O.   On: 09/02/2020 17:33    ____________________________________________   PROCEDURES  Procedure(s) performed (including Critical Care):  .1-3 Lead EKG Interpretation Performed by: Gilles Chiquito, MD Authorized by: Gilles Chiquito, MD     Interpretation: normal     ECG rate assessment: normal     Rhythm: sinus rhythm     Ectopy: none     Conduction: normal       ____________________________________________   INITIAL IMPRESSION / ASSESSMENT AND PLAN / ED COURSE        Patient presents with left history exam for assessment of some chest pain Yesterday that seemed to improve today as well as acute right lower back pain and right flank pain.  Patient is afebrile hemodynamically stable on  arrival.  Unclear etiology for patient's chest pain yesterday although is possible this was referred from proximal kidney stone.  Very low suspicion for ACS given nonelevated troponin obtained.  Then p.o. after symptom onset and reassuring EKG with patient denying any chest pain today.  Chest x-ray shows no  evidence of pneumonia, no thorax, edema or other acute process.  Patient is PERC negative and a very low suspicion for PE at this time.  Regarding patient's right lower back pain and right flank pain pression is likely symptoms from kidney stone.  CT does show evidence of kidney stone but does not show evidence of appendicitis, cholelithiasis, pancreatitis, diverticulitis or other acute intra-abdominal or pelvic pathology.  Patient is afebrile and has a normal white blood cell count and has no perinephric stranding on his CT and I have a very low suspicion for pyonephritis is somewhat difficult to exclude infection based on his UA which does show greater than 50 WBCs and rare bacteria.  Patient given 1 dose of antibiotics emergency room and will cover with Bactrim for 10 days for possible urinary tract infection.  CBC remarkable for hyperglycemia with a glucose of 153 as well as mild hypokalemia with a K of 3.2.  A1c sent as patient is no history of diabetes.  This is likely stress sponsor that will assess for evidence of diabetes.  Potassium was repleted.  CBC is unremarkable.  Hepatic function panel unremarkable evidence of cholestasis or hepatitis.  Patient given IV fluids, antiemetic and analgesia as noted below.  He stated he felt much better on reassessment.  Given he is tolerating p.o. does not appear septic or systemically ill and his pain is controlled I believe it safe for discharge with plan for outpatient urology follow-up if the symptoms do not improve.  Discharged stable condition.  Strict return cautions advised and discussed.  Advised patient to have his blood sugar rechecked at his next PCP  visit.       ____________________________________________   FINAL CLINICAL IMPRESSION(S) / ED DIAGNOSES  Final diagnoses:  Kidney stone  Hypokalemia  Chest pain, unspecified type    Medications  acetaminophen (TYLENOL) tablet 1,000 mg (has no administration in time range)  ciprofloxacin (CIPRO) IVPB 400 mg (has no administration in time range)  oxyCODONE (Oxy IR/ROXICODONE) immediate release tablet 5 mg (has no administration in time range)  lactated ringers bolus 1,000 mL (1,000 mLs Intravenous New Bag/Given 09/02/20 1708)  HYDROmorphone (DILAUDID) injection 0.5 mg (0.5 mg Intravenous Given 09/02/20 1710)  ondansetron (ZOFRAN) injection 4 mg (4 mg Intravenous Given 09/02/20 1709)  potassium chloride SA (KLOR-CON) CR tablet 40 mEq (40 mEq Oral Given 09/02/20 1710)  ketorolac (TORADOL) 30 MG/ML injection 30 mg (30 mg Intravenous Given 09/02/20 1709)     ED Discharge Orders         Ordered    oxyCODONE-acetaminophen (PERCOCET) 5-325 MG tablet  Every 8 hours PRN        09/02/20 1751    ondansetron (ZOFRAN) 4 MG tablet  Every 8 hours PRN        09/02/20 1751    tamsulosin (FLOMAX) 0.4 MG CAPS capsule  Daily        09/02/20 1751    sulfamethoxazole-trimethoprim (BACTRIM DS) 800-160 MG tablet  2 times daily        09/02/20 1751           Note:  This document was prepared using Dragon voice recognition software and may include unintentional dictation errors.   Lucrezia Starch, MD 09/02/20 [phone]

## 2020-09-02 NOTE — ED Triage Notes (Signed)
First RN Note: Pt to ED via POV with c/o RLQ abdominal pain with sudden onset. Pt noted to be pacing repeatedly stating "I'm going to pass out, I'm going to pass out", this RN encouraged patient to sit in wheelchair provided by staff, pt repeatedly stating "I can't sit, I can't sit". Pt noted to be rocking back in forth in wheelchair. Pt c/o R sided abdominal pain and back pain. Pt intermittently sitting wheelchair then up and pacing at this time.

## 2020-09-02 NOTE — ED Triage Notes (Signed)
See first nurse note. Pt has right sided flank pain starting this morning.

## 2020-09-03 LAB — URINE CULTURE: Culture: NO GROWTH

## 2021-03-04 IMAGING — CT CT RENAL STONE PROTOCOL
2 of 4 series · 16 of 46 positions shown, 18 images · non-contrast
Comparison: None.

CLINICAL DATA: Right lower quadrant abdominal pain, flank pain

EXAM:
CT ABDOMEN AND PELVIS WITHOUT CONTRAST
TECHNIQUE: Multidetector CT imaging of the abdomen and pelvis was performed
following the standard protocol without IV contrast.

[Series 2: stone full standard · axial · 0.77mm/px · z∈[-998,-568]mm · 13 of 94 slices shown, 15 images]
[im 4/94  soft-tissue]
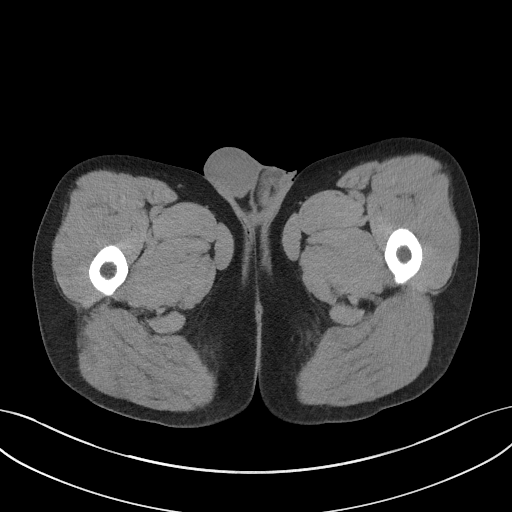
[im 4/94  bone]
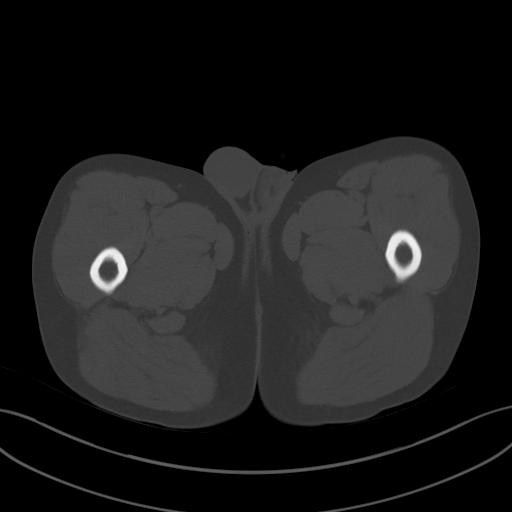
[im 11/94  soft-tissue]
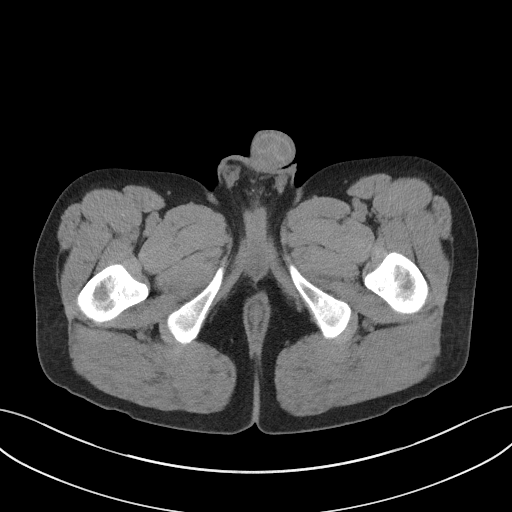
[im 18/94  soft-tissue]
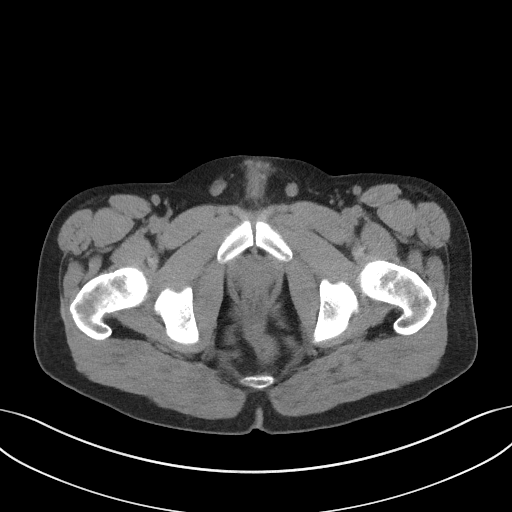
[im 26/94  soft-tissue]
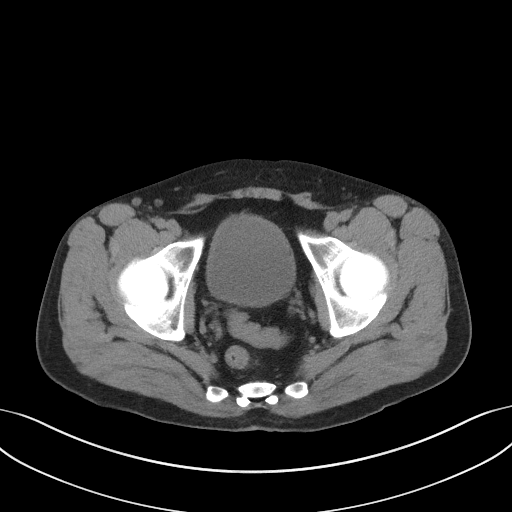
[im 33/94  soft-tissue]
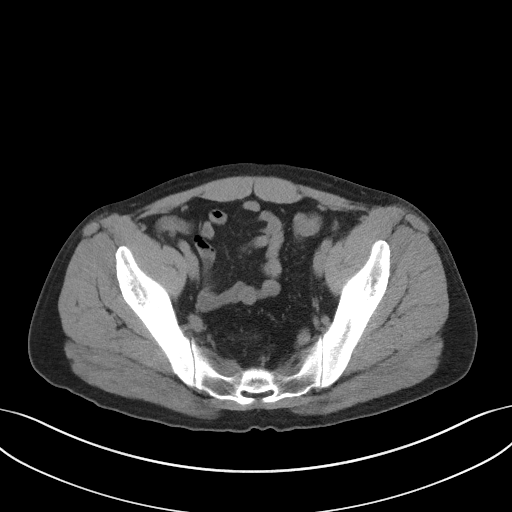
[im 40/94  soft-tissue]
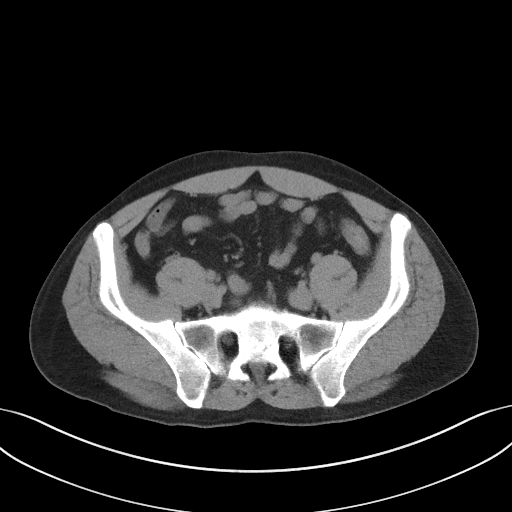
[im 47/94  soft-tissue]
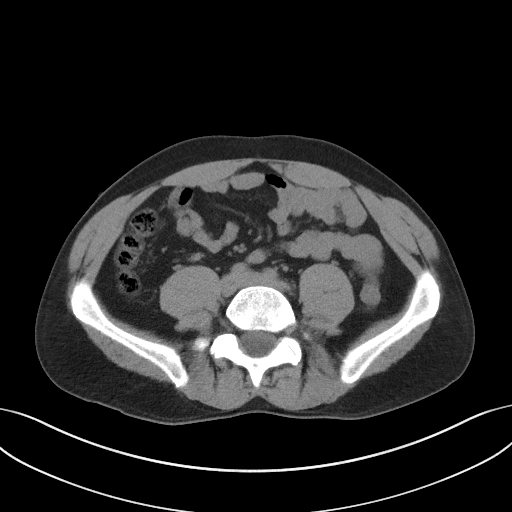
[im 54/94  soft-tissue]
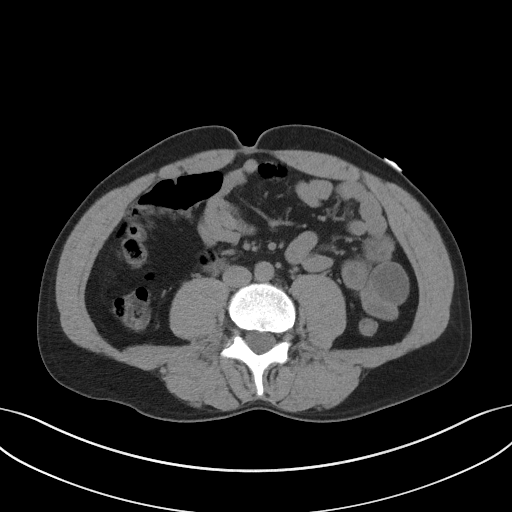
[im 61/94  soft-tissue]
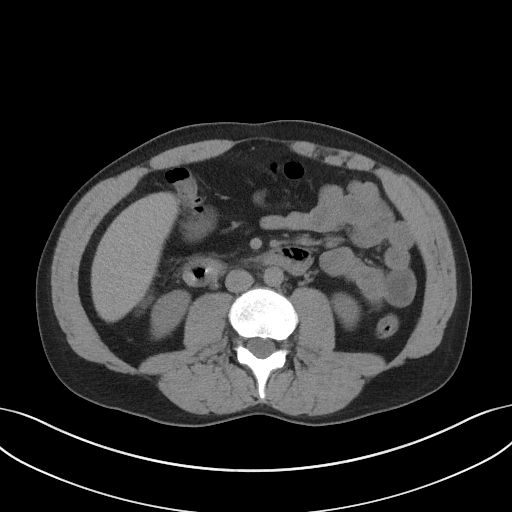
[im 61/94  bone]
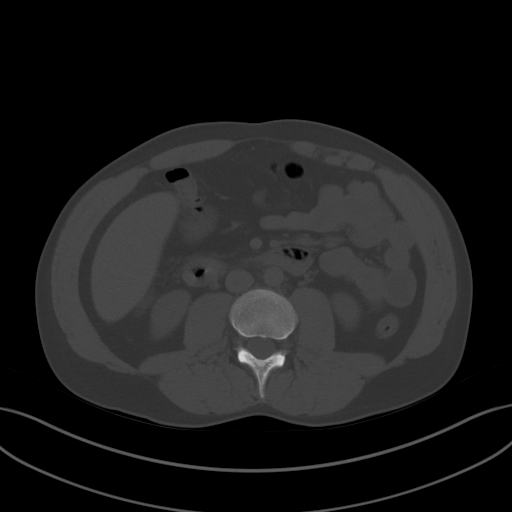
[im 68/94  soft-tissue]
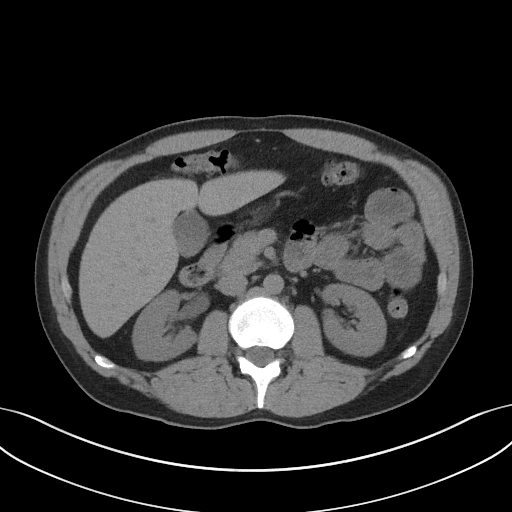
[im 76/94  soft-tissue]
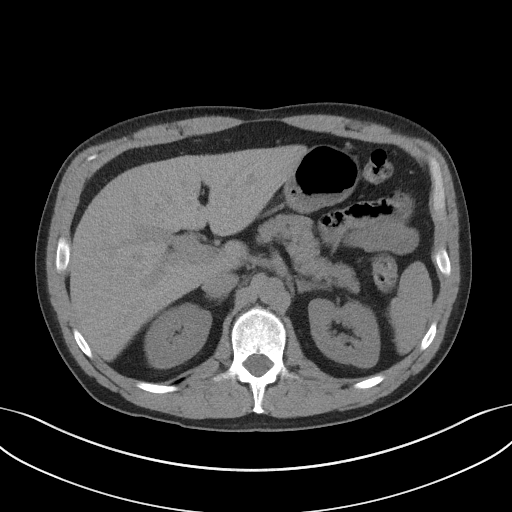
[im 83/94  soft-tissue]
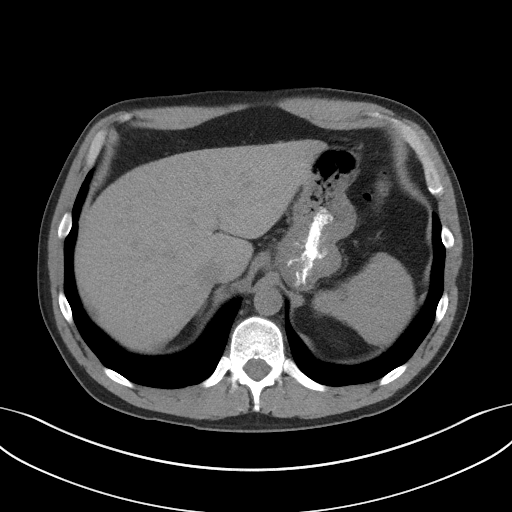
[im 90/94  soft-tissue]
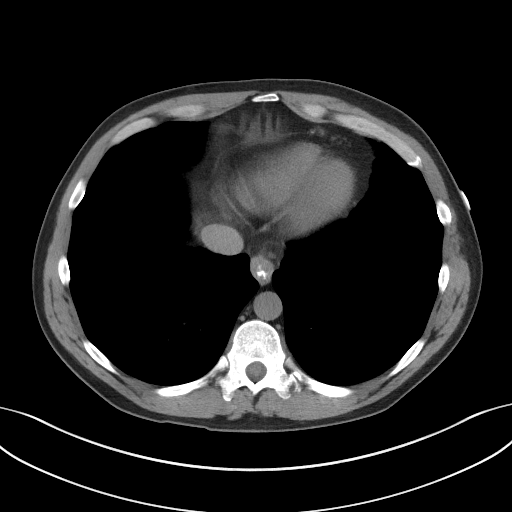

[Series 5: coronal · coronal · 0.70mm/px · 3 of 139 slices shown]
[im 47/139  soft-tissue]
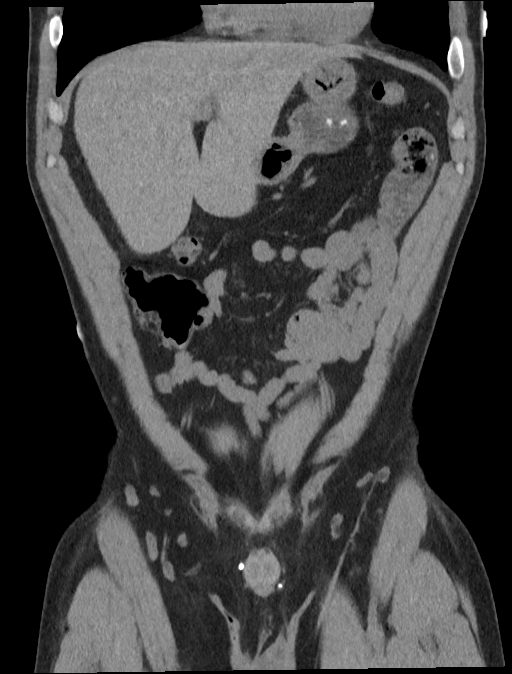
[im 62/139  soft-tissue]
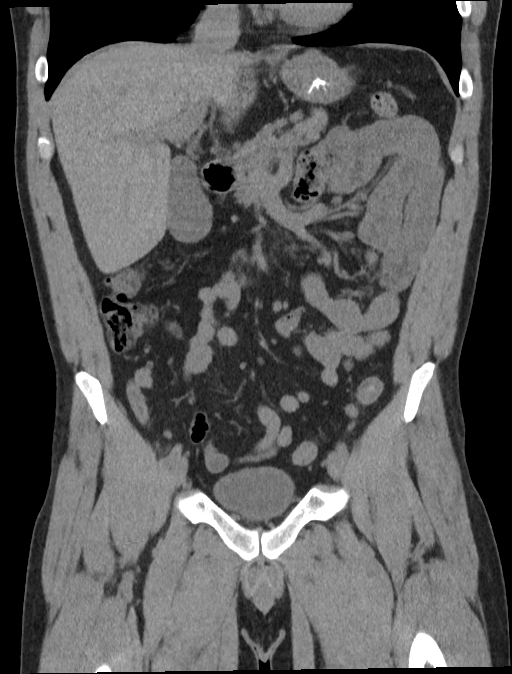
[im 77/139  soft-tissue]
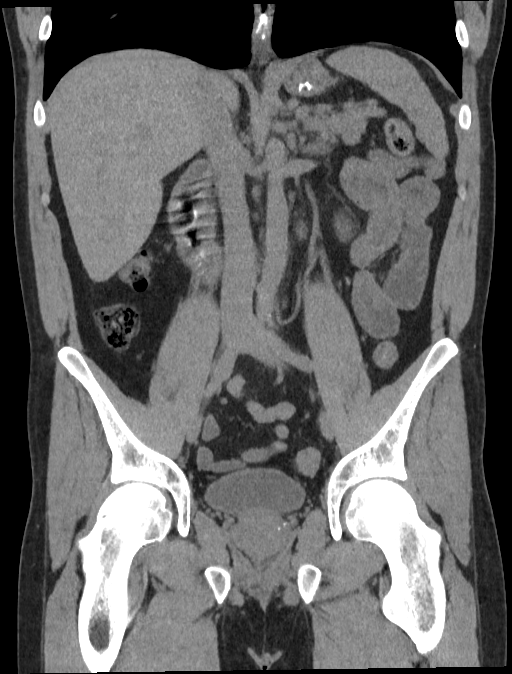

[16 of 46 positions shown; findings below may reference images not displayed]

FINDINGS: Lower chest: No acute abnormality.

Hepatobiliary: Unremarkable unenhanced appearance of the liver. No
focal liver lesion identified. Gallbladder within normal limits. No
hyperdense gallstone. No biliary dilatation.

Pancreas: Unremarkable. No pancreatic ductal dilatation or
surrounding inflammatory changes.

Spleen: Normal in size without focal abnormality.

Adrenals/Urinary Tract: Unremarkable adrenal glands. Obstructing 6 x
4 mm stone within the proximal right ureter resulting in mild right
hydronephrosis. There are 1 or 2 additional tiny punctate bilateral
renal calculi. No left-sided hydronephrosis. Left ureter is
unremarkable. Urinary bladder is within normal limits.

Stomach/Bowel: Small amount of hyperdense material within the distal
esophagus and stomach, likely oral contrast. Stomach is within
normal limits. Appendix appears normal (series 2, image 51. No
evidence of bowel wall thickening, distention, or inflammatory
changes.

Vascular/Lymphatic: No significant vascular findings are present. No
enlarged abdominal or pelvic lymph nodes.

Reproductive: Prostate is unremarkable.

Other: No free fluid. No abdominopelvic fluid collection. No
pneumoperitoneum. No abdominal wall hernia.

Musculoskeletal: No acute or significant osseous findings.
IMPRESSION: 1. Obstructing 6 x 4 mm stone within the proximal right ureter
resulting in mild right hydronephrosis.
2. Additional tiny punctate bilateral renal calculi.
3. Normal appendix.

## 2021-08-14 ENCOUNTER — Other Ambulatory Visit: Payer: Self-pay

## 2021-08-14 ENCOUNTER — Emergency Department
Admission: EM | Admit: 2021-08-14 | Discharge: 2021-08-14 | Disposition: A | Discharge status: Home / Self Care | Payer: Self-pay | Attending: Emergency Medicine | Admitting: Emergency Medicine

## 2021-08-14 DIAGNOSIS — M545 Low back pain, unspecified: Secondary | ICD-10-CM | POA: Insufficient documentation

## 2021-08-14 DIAGNOSIS — M62838 Other muscle spasm: Secondary | ICD-10-CM | POA: Insufficient documentation

## 2021-08-14 DIAGNOSIS — F17210 Nicotine dependence, cigarettes, uncomplicated: Secondary | ICD-10-CM | POA: Insufficient documentation

## 2021-08-14 MED ORDER — ACETAMINOPHEN 500 MG PO TABS
1000.0000 mg | ORAL_TABLET | Freq: Once | ORAL | Status: AC
  Administered 2021-08-14: 1000 mg via ORAL
  Filled 2021-08-14: qty 2

## 2021-08-14 MED ORDER — CYCLOBENZAPRINE HCL 10 MG PO TABS: 10.0000 mg | ORAL_TABLET | Freq: Three times a day (TID) | ORAL | 0 refills | Status: AC | PRN

## 2021-08-14 MED ORDER — PREDNISONE 10 MG PO TABS: ORAL_TABLET | ORAL | 0 refills | Status: DC

## 2021-08-14 MED ORDER — CYCLOBENZAPRINE HCL 10 MG PO TABS
10.0000 mg | ORAL_TABLET | Freq: Once | ORAL | Status: AC
  Administered 2021-08-14: 10 mg via ORAL
  Filled 2021-08-14: qty 1

## 2021-08-14 MED ORDER — IBUPROFEN 400 MG PO TABS
400.0000 mg | ORAL_TABLET | Freq: Once | ORAL | Status: AC
  Administered 2021-08-14: 400 mg via ORAL
  Filled 2021-08-14: qty 1

## 2021-08-14 MED ORDER — LIDOCAINE 5 % EX PTCH
1.0000 | MEDICATED_PATCH | CUTANEOUS | Status: DC
  Administered 2021-08-14: 1 via TRANSDERMAL
  Filled 2021-08-14: qty 1

## 2021-08-14 NOTE — ED Provider Notes (Signed)
[facility] Emergency Department Provider Note  ____________________________________________   Event Date/Time   First MD Initiated Contact with Patient 08/14/21 1701     (approximate)  I have reviewed the triage vital signs and the nursing notes.   HISTORY  Chief Complaint Back Pain   HPI Harry Graham is a 40 y.o. male with a past medical history of right-sided kidney stone who presents for assessment after 4 days of some left lower back pain.  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  He denies any right-sided back pain, abdominal pain, upper back pain, nausea, vomiting, diarrhea, constipation, pain with urination, blood in his urine, urinary or fecal incontinence or lower extremity weakness numbness or tingling.  States he took some a Profen yesterday and does not think this helped much but has not taken any analgesics today.  Denies any other acute sick symptoms or other associated sick symptoms.  No obvious preceding trauma or injuries otherwise.         No past medical history on file.  There are no problems to display for this patient.   No past surgical history on file.  Prior to Admission medications   Medication Sig Start Date End Date Taking? Authorizing Provider  cyclobenzaprine (FLEXERIL) 10 MG tablet Take 1 tablet (10 mg total) by mouth 3 (three) times daily as needed for up to 5 days for muscle spasms. 08/14/21 08/19/21 Yes Gilles Chiquito, MD  famotidine (PEPCID) 20 MG tablet Take 1 tablet (20 mg total) by mouth 2 (two) times daily. 08/10/16   Emily Filbert, MD  ondansetron (ZOFRAN) 4 MG tablet Take 1 tablet (4 mg total) by mouth every 8 (eight) hours as needed for up to 10 doses for nausea or vomiting. 09/02/20   Gilles Chiquito, MD    Allergies Amoxicillin  No family history on file.  Social History Social History   Tobacco Use    Smoking status: Every Day    Packs/day: 0.50    Types: Cigarettes   Smokeless tobacco: Never  Substance Use Topics   Alcohol use: Yes   Drug use: Yes    Types: Marijuana    Review of Systems  Review of Systems  Constitutional:  Negative for chills and fever.  HENT:  Negative for sore throat.   Eyes:  Negative for pain.  Respiratory:  Negative for cough and stridor.   Cardiovascular:  Negative for chest pain.  Gastrointestinal:  Negative for vomiting.  Musculoskeletal:  Positive for back pain.  Skin:  Negative for rash.  Neurological:  Negative for seizures, loss of consciousness and headaches.  Psychiatric/Behavioral:  Negative for suicidal ideas.   All other systems reviewed and are negative.    ____________________________________________   PHYSICAL EXAM:  VITAL SIGNS: ED Triage Vitals [08/14/21 1446]  Enc Vitals Group     BP (!) 121/100     Pulse Rate (!) 106     Resp 18     Temp 98.3 F (36.8 C)     Temp Source Oral     SpO2 96 %     Weight 170 lb (77.1 kg)     Height 5\' 9"  (1.753 m)     Head Circumference      Peak Flow      Pain Score 9     Pain Loc      Pain Edu?  Excl. in GC?    Vitals:   08/14/21 1446  BP: (!) 121/100  Pulse: (!) 106  Resp: 18  Temp: 98.3 F (36.8 C)  SpO2: 96%   Physical Exam Vitals and nursing note reviewed.  Constitutional:      General: He is not in acute distress.    Appearance: He is well-developed.  HENT:     Head: Normocephalic and atraumatic.     Right Ear: External ear normal.     Left Ear: External ear normal.     Nose: Nose normal.  Eyes:     Conjunctiva/sclera: Conjunctivae normal.  Cardiovascular:     Rate and Rhythm: Normal rate and regular rhythm.     Heart sounds: No murmur heard. Pulmonary:     Effort: Pulmonary effort is normal. No respiratory distress.     Breath sounds: Normal breath sounds.  Abdominal:     Palpations: Abdomen is soft.     Tenderness: There is no abdominal tenderness.   Musculoskeletal:        General: No swelling.     Cervical back: Neck supple.  Skin:    General: Skin is warm and dry.     Capillary Refill: Capillary refill takes less than 2 seconds.  Neurological:     Mental Status: He is alert and oriented to person, place, and time.  Psychiatric:        Mood and Affect: Mood normal.    Patient has full strength of the bilateral lower extremities including at the bilateral hips, knees and ankles.  2+ bilateral patellar reflexes.  2+ PT pulses bilaterally.  Sensation is intact to light touch bilaterally.  There is no midline tenderness over the spine although there is a palpable spasming muscle in the left paralumbar area.  No overlying skin changes or any tenderness or spasm palpated on the right.  Patient able to ambulate with steady gait unassisted. ____________________________________________   LABS (all labs ordered are listed, but only abnormal results are displayed)  Labs Reviewed - No data to display ____________________________________________  EKG  ____________________________________________  RADIOLOGY  ED MD interpretation:    Official radiology report(s): No results found.  ____________________________________________   PROCEDURES  Procedure(s) performed (including Critical Care):  Procedures   ____________________________________________   INITIAL IMPRESSION / ASSESSMENT AND PLAN / ED COURSE        Patient presents with above-stated history exam for assessment of some left lower back pain for the last couple of days in the setting of doing primary physical work.  No preceding trauma or injuries.  It is worse with some movements.  On arrival he is slightly tachycardic with otherwise stable vital signs on room air.  He does have muscle spasm palpable on exam in the left lower back.  There is no overlying skin changes to suggest cellulitis or midline pain or other symptoms or historical features to suggest spinal cord  infection or cord compression.  Given palpable muscle spasm without any other associated factors have a low suspicion for kidney stone at this time.  We will treat with Tylenol, ibuprofen, lidocaine patch and Flexeril and outpatient follow-up with PCP.  Advised that if he gets any worse or develops any new symptoms he must come either back to emergency room for further evaluation and management.  He is amenable to plan.  Discharged stable condition.  Strict return precautions advised and discussed.         ____________________________________________   FINAL CLINICAL IMPRESSION(S) / ED DIAGNOSES  Final diagnoses:  Acute left-sided low back pain without sciatica  Muscle spasm    Medications  acetaminophen (TYLENOL) tablet 1,000 mg (has no administration in time range)  ibuprofen (ADVIL) tablet 400 mg (has no administration in time range)  lidocaine (LIDODERM) 5 % 1 patch (has no administration in time range)  cyclobenzaprine (FLEXERIL) tablet 10 mg (has no administration in time range)     ED Discharge Orders          Ordered    cyclobenzaprine (FLEXERIL) 10 MG tablet  3 times daily PRN        08/14/21 1711    predniSONE (DELTASONE) 10 MG tablet  Status:  Discontinued        08/14/21 1711             Note:  This document was prepared using Dragon voice recognition software and may include unintentional dictation errors.    Gilles Chiquito, MD 08/14/21 [phone]

## 2021-08-14 NOTE — ED Triage Notes (Signed)
Pt here with back pain for a few days. Pt does repeated movements at work, does heating and air. Pt states pain has been there for a while but has gotten worse over the past few days. Pt in NAD and ambulatory to triage.

## 2021-08-14 NOTE — ED Notes (Signed)
Pt with acute on chronic back pain. Denies injury. Does not have pcp
